# Patient Record
Sex: Female | Born: 2000 | Race: White | Hispanic: No | Marital: Single
Health system: Southern US, Community
[De-identification: ages and names within clinical notes are randomized; demographics above are authoritative.]

## PROBLEM LIST (undated history)

## (undated) DIAGNOSIS — F419 Anxiety disorder, unspecified: Secondary | ICD-10-CM

## (undated) DIAGNOSIS — F319 Bipolar disorder, unspecified: Secondary | ICD-10-CM

## (undated) DIAGNOSIS — F32A Depression, unspecified: Secondary | ICD-10-CM

## (undated) HISTORY — PX: OTHER SURGICAL HISTORY: SHX169

## (undated) HISTORY — DX: Anxiety disorder, unspecified: F41.9

## (undated) HISTORY — DX: Depression, unspecified: F32.A

## (undated) HISTORY — DX: Bipolar disorder, unspecified: F31.9

---

## 2020-07-31 ENCOUNTER — Emergency Department (HOSPITAL_COMMUNITY): Admission: EM | Admit: 2020-07-31 | Discharge: 2020-07-31 | Discharge status: Against Medical Advice | Payer: Self-pay

## 2020-07-31 ENCOUNTER — Ambulatory Visit (HOSPITAL_COMMUNITY)
Admission: EM | Admit: 2020-07-31 | Discharge: 2020-07-31 | Disposition: A | Discharge status: Other Acute Inpatient Hospital | Payer: Medicaid Other | Attending: Psychiatry | Admitting: Psychiatry

## 2020-07-31 ENCOUNTER — Encounter (HOSPITAL_COMMUNITY): Payer: Self-pay

## 2020-07-31 ENCOUNTER — Emergency Department (HOSPITAL_COMMUNITY)
Admission: EM | Admit: 2020-07-31 | Discharge: 2020-08-02 | Disposition: A | Discharge status: Home / Self Care | Payer: Medicaid Other | Attending: Emergency Medicine | Admitting: Emergency Medicine

## 2020-07-31 ENCOUNTER — Other Ambulatory Visit: Payer: Self-pay

## 2020-07-31 DIAGNOSIS — Y999 Unspecified external cause status: Secondary | ICD-10-CM | POA: Insufficient documentation

## 2020-07-31 DIAGNOSIS — F1390 Sedative, hypnotic, or anxiolytic use, unspecified, uncomplicated: Secondary | ICD-10-CM | POA: Insufficient documentation

## 2020-07-31 DIAGNOSIS — S0990XA Unspecified injury of head, initial encounter: Secondary | ICD-10-CM | POA: Diagnosis present

## 2020-07-31 DIAGNOSIS — S40021A Contusion of right upper arm, initial encounter: Secondary | ICD-10-CM | POA: Insufficient documentation

## 2020-07-31 DIAGNOSIS — Z20822 Contact with and (suspected) exposure to covid-19: Secondary | ICD-10-CM | POA: Insufficient documentation

## 2020-07-31 DIAGNOSIS — G44319 Acute post-traumatic headache, not intractable: Secondary | ICD-10-CM | POA: Insufficient documentation

## 2020-07-31 DIAGNOSIS — F1010 Alcohol abuse, uncomplicated: Secondary | ICD-10-CM | POA: Diagnosis not present

## 2020-07-31 DIAGNOSIS — Z915 Personal history of self-harm: Secondary | ICD-10-CM | POA: Insufficient documentation

## 2020-07-31 DIAGNOSIS — S40022A Contusion of left upper arm, initial encounter: Secondary | ICD-10-CM | POA: Insufficient documentation

## 2020-07-31 DIAGNOSIS — T1491XA Suicide attempt, initial encounter: Secondary | ICD-10-CM | POA: Insufficient documentation

## 2020-07-31 DIAGNOSIS — Y9389 Activity, other specified: Secondary | ICD-10-CM | POA: Insufficient documentation

## 2020-07-31 DIAGNOSIS — X789XXA Intentional self-harm by unspecified sharp object, initial encounter: Secondary | ICD-10-CM | POA: Insufficient documentation

## 2020-07-31 DIAGNOSIS — S8002XA Contusion of left knee, initial encounter: Secondary | ICD-10-CM | POA: Insufficient documentation

## 2020-07-31 DIAGNOSIS — R45851 Suicidal ideations: Secondary | ICD-10-CM | POA: Diagnosis not present

## 2020-07-31 DIAGNOSIS — Y92838 Other recreation area as the place of occurrence of the external cause: Secondary | ICD-10-CM | POA: Insufficient documentation

## 2020-07-31 DIAGNOSIS — F1310 Sedative, hypnotic or anxiolytic abuse, uncomplicated: Secondary | ICD-10-CM

## 2020-07-31 DIAGNOSIS — F332 Major depressive disorder, recurrent severe without psychotic features: Secondary | ICD-10-CM | POA: Insufficient documentation

## 2020-07-31 DIAGNOSIS — W228XXA Striking against or struck by other objects, initial encounter: Secondary | ICD-10-CM | POA: Diagnosis not present

## 2020-07-31 DIAGNOSIS — S60812A Abrasion of left wrist, initial encounter: Secondary | ICD-10-CM | POA: Insufficient documentation

## 2020-07-31 DIAGNOSIS — S0081XA Abrasion of other part of head, initial encounter: Secondary | ICD-10-CM | POA: Insufficient documentation

## 2020-07-31 LAB — ETHANOL: Alcohol, Ethyl (B): <10 mg/dL (ref <10)

## 2020-07-31 LAB — COMPREHENSIVE METABOLIC PANEL
ALT: 16 U/L (ref 0–44)
AST: 43 U/L — ABNORMAL HIGH (ref 15–41)
Albumin: 4.6 g/dL (ref 3.5–5.0)
Alkaline Phosphatase: 63 U/L (ref 38–126)
Anion gap: 11 (ref 5–15)
BUN: 9 mg/dL (ref 6–20)
CO2: 25 mmol/L (ref 22–32)
Calcium: 9.4 mg/dL (ref 8.9–10.3)
Chloride: 105 mmol/L (ref 98–111)
Creatinine, Ser: 0.85 mg/dL (ref 0.44–1.00)
GFR calc Af Amer: >60 mL/min (ref >60)
GFR calc non Af Amer: >60 mL/min (ref >60)
Glucose, Bld: 76 mg/dL (ref 70–99)
Potassium: 3.4 mmol/L — ABNORMAL LOW (ref 3.5–5.1)
Sodium: 141 mmol/L (ref 135–145)
Total Bilirubin: 1.5 mg/dL — ABNORMAL HIGH (ref 0.3–1.2)
Total Protein: 7 g/dL (ref 6.5–8.1)

## 2020-07-31 LAB — RAPID URINE DRUG SCREEN, HOSP PERFORMED
Amphetamines: NOT DETECTED
Barbiturates: NOT DETECTED
Benzodiazepines: POSITIVE — AB
Cocaine: NOT DETECTED
Opiates: NOT DETECTED
Tetrahydrocannabinol: POSITIVE — AB

## 2020-07-31 LAB — CBC
HCT: 47.4 % — ABNORMAL HIGH (ref 36.0–46.0)
Hemoglobin: 15.7 g/dL — ABNORMAL HIGH (ref 12.0–15.0)
MCH: 31.3 pg (ref 26.0–34.0)
MCHC: 33.1 g/dL (ref 30.0–36.0)
MCV: 94.4 fL (ref 80.0–100.0)
Platelets: 339 10*3/uL (ref 150–400)
RBC: 5.02 MIL/uL (ref 3.87–5.11)
RDW: 12.5 % (ref 11.5–15.5)
WBC: 8.5 10*3/uL (ref 4.0–10.5)
nRBC: 0 % (ref 0.0–0.2)

## 2020-07-31 LAB — ACETAMINOPHEN LEVEL: Acetaminophen (Tylenol), Serum: <10 ug/mL — ABNORMAL LOW (ref 10–30)

## 2020-07-31 LAB — I-STAT BETA HCG BLOOD, ED (MC, WL, AP ONLY): I-stat hCG, quantitative: <5 m[IU]/mL (ref <5)

## 2020-07-31 LAB — SALICYLATE LEVEL: Salicylate Lvl: <7 mg/dL — ABNORMAL LOW (ref 7.0–30.0)

## 2020-07-31 MED ORDER — IBUPROFEN 400 MG PO TABS
400.0000 mg | ORAL_TABLET | Freq: Once | ORAL | Status: AC | PRN
  Administered 2020-07-31: 400 mg via ORAL
  Filled 2020-07-31: qty 1

## 2020-07-31 NOTE — ED Notes (Signed)
Called x 3 NO answer 

## 2020-07-31 NOTE — ED Notes (Signed)
Maxine Glenn (mother): [phone]
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
## 2020-07-31 NOTE — ED Notes (Signed)
No answer from pt in waiting room 
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
## 2020-07-31 NOTE — ED Notes (Signed)
Belongings in locker 15 

## 2020-07-31 NOTE — ED Notes (Signed)
Pt c/o of headache.  

## 2020-07-31 NOTE — ED Notes (Signed)
This NT gave burgundy scrubs to the pt.

## 2020-08-01 ENCOUNTER — Emergency Department (HOSPITAL_COMMUNITY): Payer: Medicaid Other

## 2020-08-01 LAB — SARS CORONAVIRUS 2 BY RT PCR (HOSPITAL ORDER, PERFORMED IN ~~LOC~~ HOSPITAL LAB): SARS Coronavirus 2: NEGATIVE

## 2020-08-01 MED ORDER — ACETAMINOPHEN 500 MG PO TABS
1000.0000 mg | ORAL_TABLET | Freq: Once | ORAL | Status: AC
  Administered 2020-08-01: 1000 mg via ORAL
  Filled 2020-08-01: qty 2

## 2020-08-01 MED ORDER — IBUPROFEN 400 MG PO TABS
600.0000 mg | ORAL_TABLET | Freq: Three times a day (TID) | ORAL | Status: DC | PRN
  Administered 2020-08-01 – 2020-08-02 (×4): 600 mg via ORAL
  Filled 2020-08-01 (×4): qty 1

## 2020-08-01 MED ORDER — ACETAMINOPHEN 325 MG PO TABS
650.0000 mg | ORAL_TABLET | Freq: Once | ORAL | Status: AC
  Administered 2020-08-01: 650 mg via ORAL
  Filled 2020-08-01: qty 2

## 2020-08-01 NOTE — ED Notes (Signed)
Patient is resting comfortably. 

## 2020-08-01 NOTE — ED Notes (Signed)
This RN inventoried all this pts belongings. All belongings were placed in two separate bags and placed in [place] #2. All valuables were placed in Envelope #[id number] and secured with security.   Pt was wanded earlier by security and pt signed all necessary documentation.

## 2020-08-01 NOTE — ED Notes (Signed)
Dr Denton Lank assessed pt and will order CT scan and she will be reassessed by [facility].

## 2020-08-01 NOTE — ED Notes (Signed)
Patient is in bed comfortably watching tv.

## 2020-08-01 NOTE — ED Notes (Signed)
Pt is with sitter at this time getting a shower.

## 2020-08-01 NOTE — Progress Notes (Signed)
Patient meets criteria for inpatient treatment per Berneice Heinrich NP. There are no available beds at [facility] currently. CSW faxed referrals to the following facilities for review:  [facility]  TTS will continue to seek bed placement.   Trula Slade, MSW, LCSW Clinical Social Worker 08/01/2020 8:29 AM
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
## 2020-08-01 NOTE — ED Notes (Signed)
Pt provided ice water 

## 2020-08-01 NOTE — ED Notes (Signed)
Notified EDP, BH and CN of pt request for re evaluation.
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
## 2020-08-01 NOTE — Progress Notes (Signed)
Per Brittney at [facility], Pt has been accepted for admission for Monday, 9/13 anytime pending negative COVID test (results are pending currently and will be faxed once available). Accepting provider: Dr. Vilinda Flake MD. Pt will go to [address] [place]. Number for report: [phone]. Initially, pt being IVCed was discussed due to recent SI attempt and for safe transport to and from the facility. However, CSW spoke with Alma Friendly TOC/RN who confirmed that [facility] would be able to transport pt if additional paperwork were completed. She was willing to touch base with pt's RN, Benjamine Mola in order to assist with this, unless the provider felt she met IVC criteria. CSW notified [facility] of disposition as well.  CSW attempted to call Dr. Rock Nephew MD to discuss possible need for IVC [phone] (no answer).   CSW contacted Brittney in admissions at [facility] to let her know that pt will likely arrive via [facility] as voluntary. Her only concern was transport back home. She confirmed that pt may arrive as voluntary or IVC but that if [facility], the paperwork would need to faxed to their admissions at : [phone].   CSW will fax COVID results this afternoon once they become available.    Rebecca Poole S. Ouida Sills, MSW, LCSW Clinical Social Worker 08/01/2020 9:59 AM

## 2020-08-01 NOTE — Progress Notes (Signed)
Negative COVID test result faxed to [facility] Admissions per request of Brittney (intake coordinator).   Laniqua Torrens S. Alan Ripper, MSW, LCSW Clinical Social Worker 08/01/2020 10:32 AM

## 2020-08-01 NOTE — ED Notes (Signed)
TTS placed at Bedside for evaluation.

## 2020-08-01 NOTE — ED Notes (Signed)
Pt took phone call from her mother and then became upset per the sitter. Provided number for SW/CM to see if they might talk with her.

## 2020-08-01 NOTE — ED Notes (Signed)
Belongings are in [place] #2 They have NOT been inventoried yet, just removed from triage area
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
## 2020-08-02 NOTE — ED Notes (Signed)
Leanne given report at [facility].

## 2020-08-02 NOTE — ED Notes (Signed)
RN attempted report and sent in form for transportation thru [facility]

## 2021-05-21 IMAGING — CT CT HEAD W/O CM
4 series · 16 of 47 positions shown, 18 images · non-contrast
Comparison: CT 02/04/2020

CLINICAL DATA: Headache

EXAM:
CT HEAD WITHOUT CONTRAST
TECHNIQUE: Contiguous axial images were obtained from the base of the skull
through the vertex without intravenous contrast.

[Series 3: head wo · axial · 0.39mm/px · z∈[-134,-24]mm · 7 of 30 slices shown, 9 images]
[im 4/30  brain]
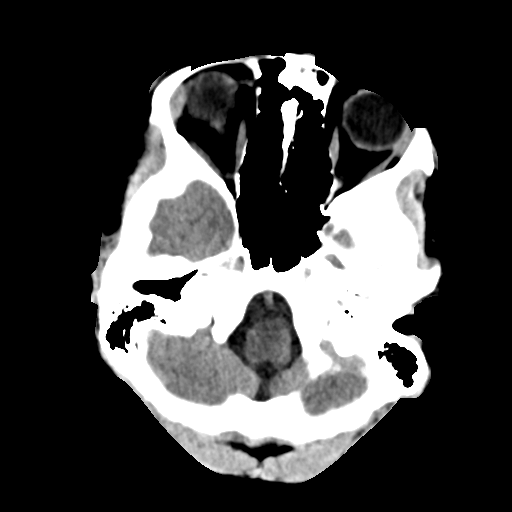
[im 4/30  bone]
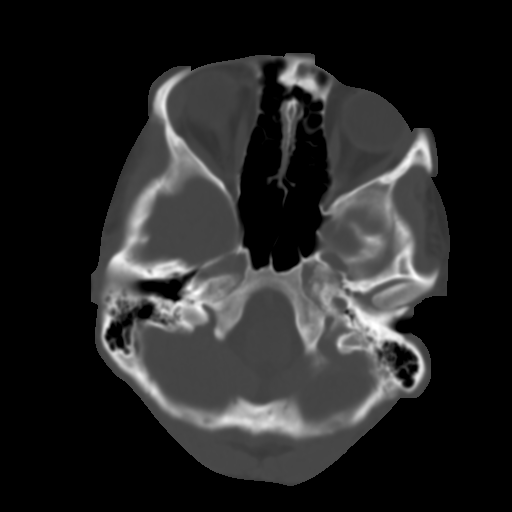
[im 8/30  brain]
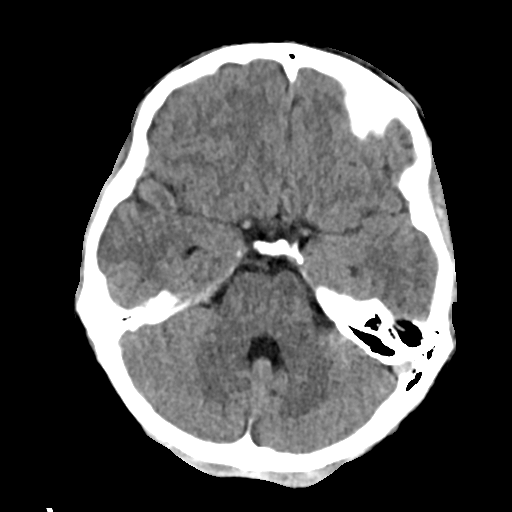
[im 11/30  brain]
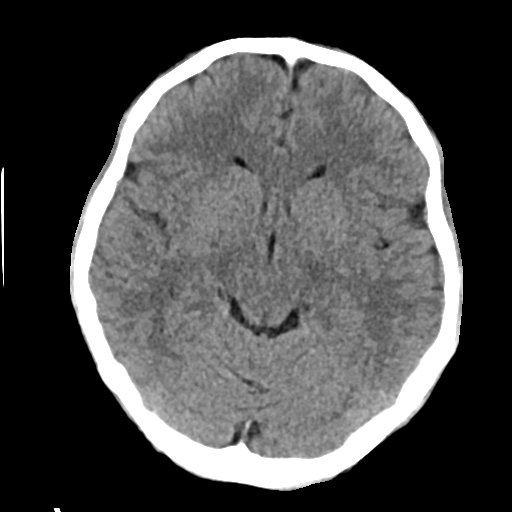
[im 15/30  brain]
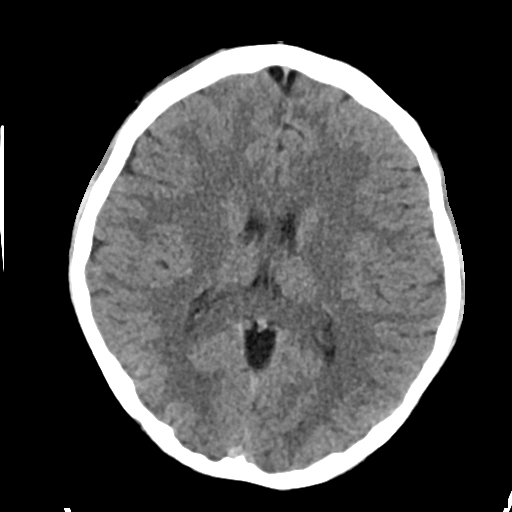
[im 19/30  brain]
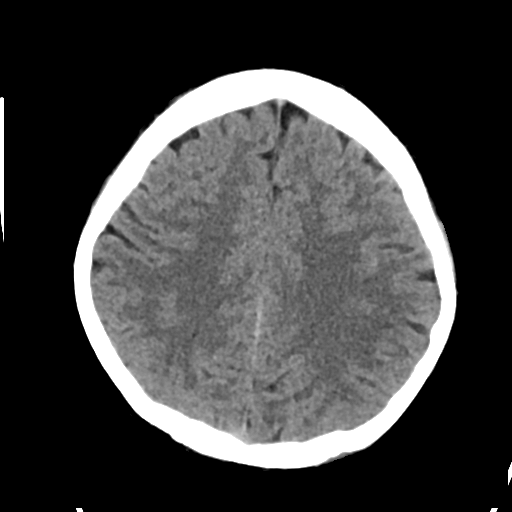
[im 19/30  bone]
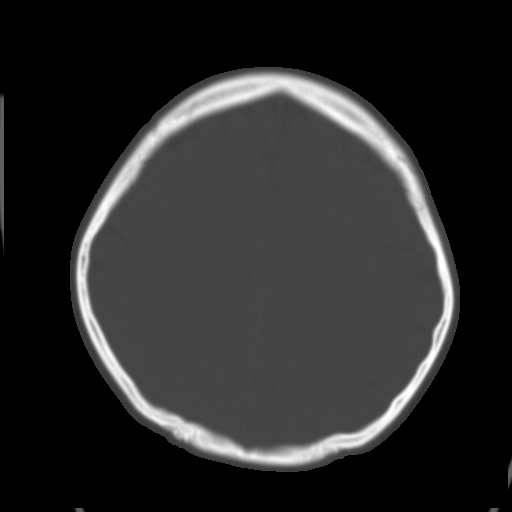
[im 22/30  brain]
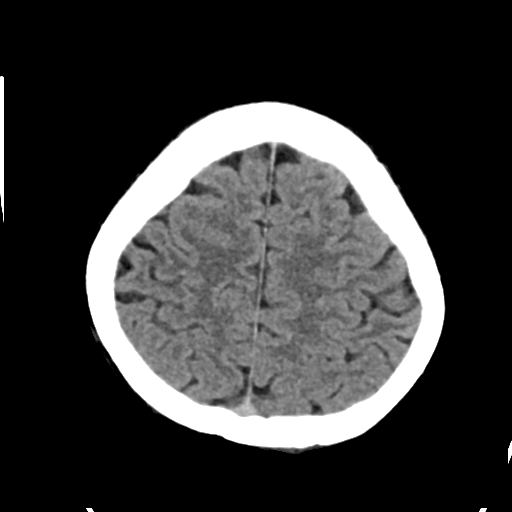
[im 26/30  brain]
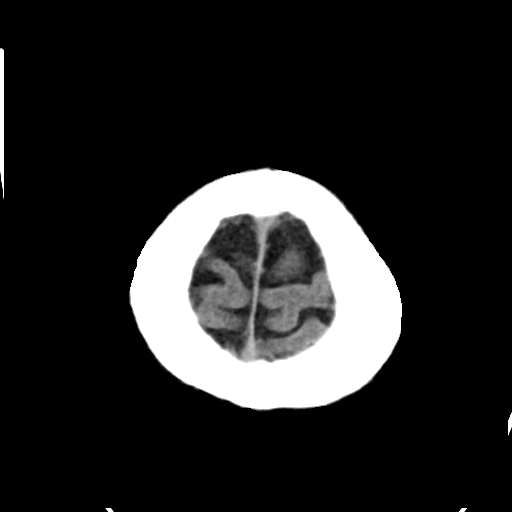

[Series 4: head bone · axial · 0.39mm/px · z∈[-135,-105]mm · 3 of 75 slices shown]
[im 8/75  bone]
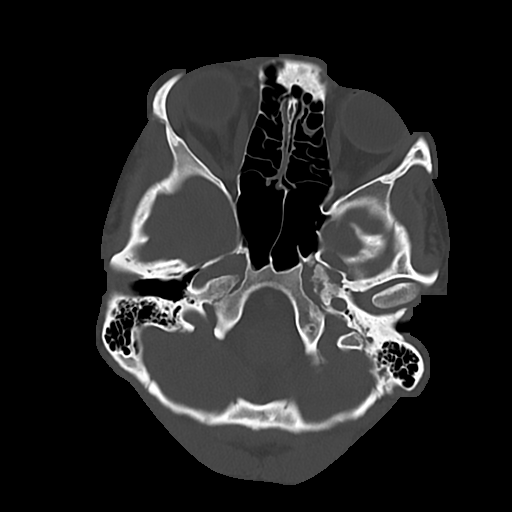
[im 15/75  bone]
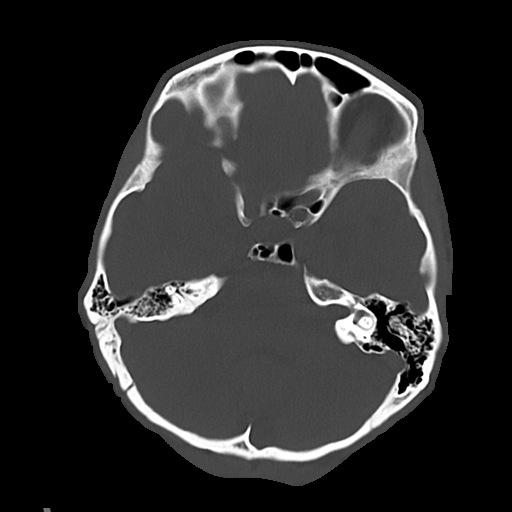
[im 23/75  bone]
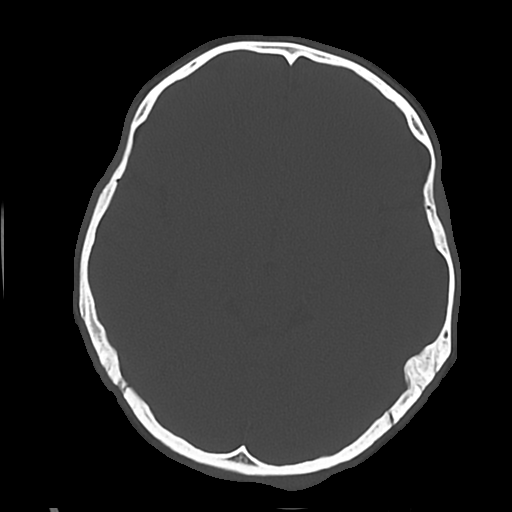

[Series 5: cor soft · coronal · 0.29mm/px · 3 of 64 slices shown]
[im 22/64  brain]
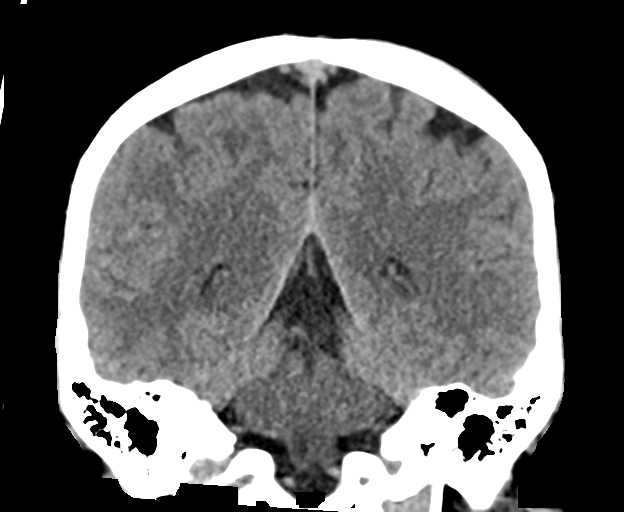
[im 29/64  brain]
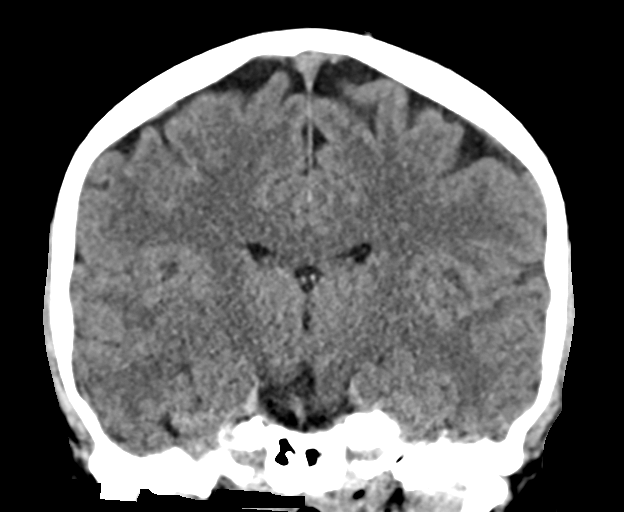
[im 36/64  brain]
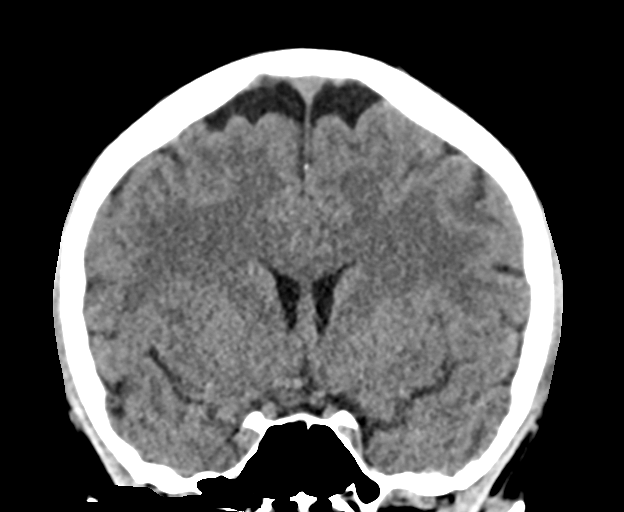

[Series 6: sag soft · sagittal · 0.29mm/px · 3 of 62 slices shown]
[im 21/62  brain]
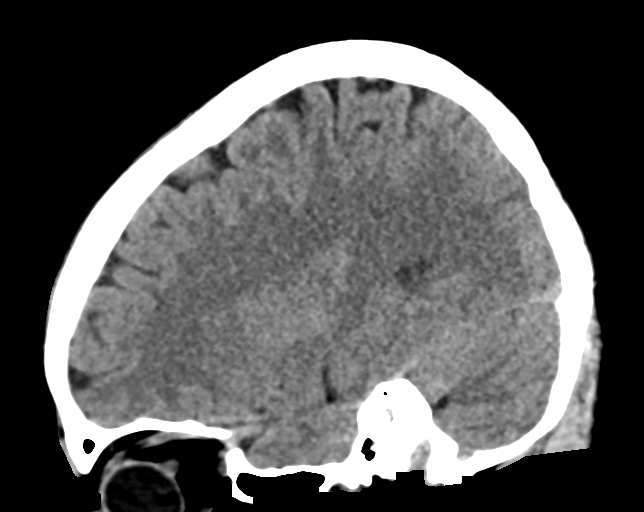
[im 31/62  brain]
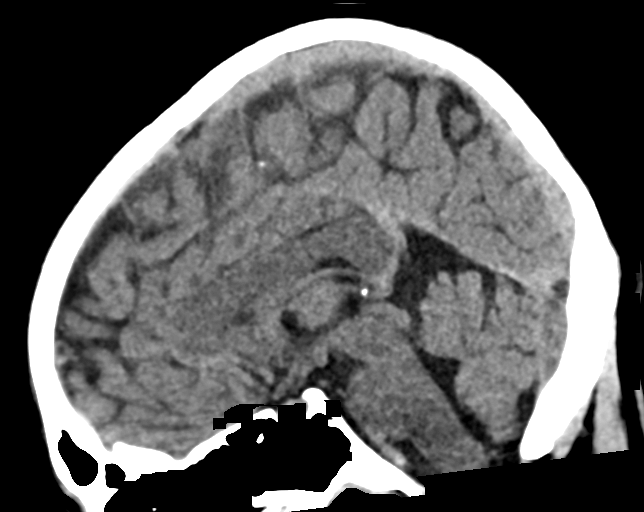
[im 41/62  brain]
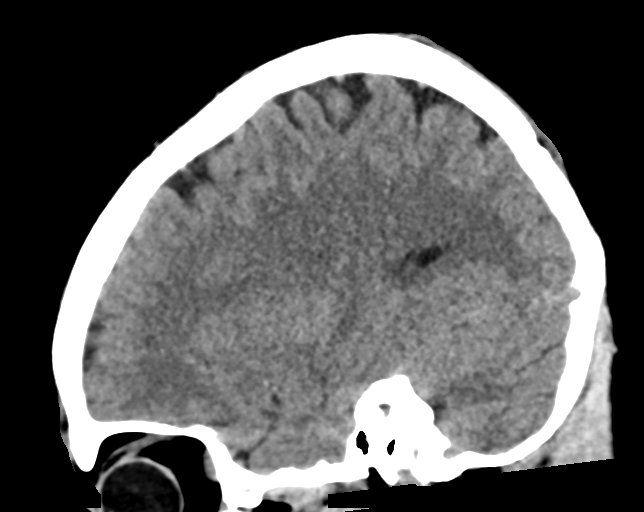

[16 of 47 positions shown; findings below may reference images not displayed]

FINDINGS: Brain: No evidence of acute infarction, hemorrhage, hydrocephalus,
extra-axial collection or mass lesion/mass effect. Basal cisterns
are patent. Midline intracranial structures are unremarkable.
Cerebellar tonsils are normally positioned.

Vascular: No hyperdense vessel or unexpected calcification.

Skull: No calvarial fracture or suspicious osseous lesion. No scalp
swelling or hematoma.

Sinuses/Orbits: Prior left lens transplant. Orbits are otherwise
unremarkable. Minimal mural thickening in the left ethmoid air
cells. Paranasal sinuses and mastoid air cells are otherwise
predominantly clear without layering air-fluid levels or pneumatized
secretions.

Other: None
IMPRESSION: 1. No acute intracranial findings.
2. Prior left lens transplant.
